# Patient Record
Sex: Male | Born: 1990 | Race: White | Hispanic: No | Marital: Single | Smoking: Current every day smoker
Health system: Southern US, Community
[De-identification: ages and names within clinical notes are randomized; demographics above are authoritative.]

---

## 2020-12-13 ENCOUNTER — Encounter (HOSPITAL_BASED_OUTPATIENT_CLINIC_OR_DEPARTMENT_OTHER): Payer: Self-pay

## 2020-12-13 ENCOUNTER — Other Ambulatory Visit: Payer: Self-pay

## 2020-12-13 ENCOUNTER — Emergency Department (HOSPITAL_BASED_OUTPATIENT_CLINIC_OR_DEPARTMENT_OTHER)
Admission: EM | Admit: 2020-12-13 | Discharge: 2020-12-13 | Disposition: A | Discharge status: Home / Self Care | Payer: Self-pay | Attending: Emergency Medicine | Admitting: Emergency Medicine

## 2020-12-13 DIAGNOSIS — Z20822 Contact with and (suspected) exposure to covid-19: Secondary | ICD-10-CM | POA: Insufficient documentation

## 2020-12-13 DIAGNOSIS — J069 Acute upper respiratory infection, unspecified: Secondary | ICD-10-CM | POA: Insufficient documentation

## 2020-12-13 LAB — SARS CORONAVIRUS 2 (TAT 6-24 HRS): SARS Coronavirus 2: NEGATIVE

## 2020-12-13 MED ORDER — BENZONATATE 100 MG PO CAPS: 100.0000 mg | ORAL_CAPSULE | Freq: Three times a day (TID) | ORAL | 0 refills | Status: AC | PRN

## 2020-12-13 MED ORDER — ONDANSETRON 4 MG PO TBDP: 4.0000 mg | ORAL_TABLET | Freq: Three times a day (TID) | ORAL | 0 refills | Status: AC | PRN

## 2020-12-13 MED ORDER — IBUPROFEN 400 MG PO TABS
600.0000 mg | ORAL_TABLET | Freq: Once | ORAL | Status: AC
  Administered 2020-12-13: 600 mg via ORAL
  Filled 2020-12-13: qty 1

## 2020-12-13 NOTE — ED Triage Notes (Signed)
PT here with multiple c/o including sore throat, sweats/chills, cough, HA, and body aches x 3 days.  Tylenol PTA.  Also c/o nausea but no V/D.  Loss of taste recently

## 2020-12-13 NOTE — Discharge Instructions (Addendum)
Please read instructions below.  You can alternate Tylenol/acetaminophen and Advil/ibuprofen/Motrin every 4 hours for sore throat, body aches, headache or fever.  Drink plenty of water.  Use saline nasal spray for congestion. You can take Tessalon every 8 hours as needed for cough. You can take the Zofran every 8 hours as needed for nausea. Wash your hands frequently. You have a COVID test pending. Please isolate at home while awaiting your results.  You can follow your results on MyChart. > If your test is negative, stay home until your fever has resolved/your symptoms are improving. > If your test is positive, isolate at home for at least 7 days after the day your symptoms initially began, and THEN at least 24 hours after you are fever-free without the help of medications AND your symptoms are improving. Only once your symptoms are improving and you are fever-free can you come out out of quarantine. Once, then you should wear a mask in public for another 5 days. Follow up with your primary care provider. Return to the ER for significant shortness of breath, uncontrollable vomiting, severe chest pain, or other concerning symptoms.

## 2020-12-13 NOTE — ED Provider Notes (Signed)
[facility] EMERGENCY DEPARTMENT Provider Note   CSN: [id number] Arrival date & time: 12/13/20  1005     History Chief Complaint  Patient presents with  . Sore Throat    Randy Guzman is a 30 y.o. male presenting for evaluation of URI symptoms that began on Saturday.  He endorses sore throat, loss of taste, dry cough, nasal congestion and intermittent nausea.  Also endorses headache.  Some subjective fevers with intermittent chills and sweats.  Has treated his symptoms with Tylenol.  No known exposures, has not had any COVID vaccines.  No shortness of breath or abdominal pain.  The history is provided by the patient.       History reviewed. No pertinent past medical history.  There are no problems to display for this patient.   History reviewed. No pertinent surgical history.     History reviewed. No pertinent family history.  Social History   Tobacco Use  . Smoking status: Never Smoker  . Smokeless tobacco: Never Used  Vaping Use  . Vaping Use: Never used  Substance Use Topics  . Alcohol use: Never  . Drug use: Never    Home Medications Prior to Admission medications   Medication Sig Start Date End Date Taking? Authorizing Provider  benzonatate (TESSALON) 100 MG capsule Take 1 capsule (100 mg total) by mouth 3 (three) times daily as needed for cough. 12/13/20  Yes Roxan Hockey, [place] N, PA-C  ondansetron (ZOFRAN ODT) 4 MG disintegrating tablet Take 1 tablet (4 mg total) by mouth every 8 (eight) hours as needed for nausea or vomiting. 12/13/20  Yes Gatlyn Lipari, [place] N, PA-C    Allergies    Patient has no allergy information on record.  Review of Systems   Review of Systems  HENT: Positive for congestion and sore throat.   Respiratory: Positive for cough.   Gastrointestinal: Positive for nausea.  Musculoskeletal: Negative for myalgias.  Neurological: Positive for headaches.    Physical Exam Updated Vital Signs BP 116/85 (BP Location: Right Arm)    Pulse 86   Temp 98.5 F (36.9 C) (Oral)   Resp 18   Ht 6' (1.829 m)   Wt 72.6 kg   SpO2 99%   BMI 21.70 kg/m   Physical Exam Vitals and nursing note reviewed.  Constitutional:      General: He is not in acute distress.    Appearance: He is well-developed.  HENT:     Head: Normocephalic and atraumatic.     Nose: Nose normal.     Mouth/Throat:     Mouth: Mucous membranes are moist.     Pharynx: Oropharynx is clear.  Eyes:     Conjunctiva/sclera: Conjunctivae normal.  Cardiovascular:     Rate and Rhythm: Normal rate and regular rhythm.  Pulmonary:     Effort: Pulmonary effort is normal. No respiratory distress.     Breath sounds: Normal breath sounds.  Abdominal:     General: Bowel sounds are normal.     Tenderness: There is no abdominal tenderness.  Musculoskeletal:     Cervical back: Normal range of motion and neck supple.  Neurological:     Mental Status: He is alert.  Psychiatric:        Mood and Affect: Mood normal.        Behavior: Behavior normal.     ED Results / Procedures / Treatments   Labs (all labs ordered are listed, but only abnormal results are displayed) Labs Reviewed  SARS CORONAVIRUS 2 (TAT  6-24 HRS)    EKG None  Radiology No results found.  Procedures Procedures   Medications Ordered in ED Medications  ibuprofen (ADVIL) tablet 600 mg (600 mg Oral Given 12/13/20 1057)    ED Course  I have reviewed the triage vital signs and the nursing notes.  Pertinent labs & imaging results that were available during my care of the patient were reviewed by me and considered in my medical decision making (see chart for details).    MDM Rules/Calculators/A&P                          Patients symptoms are consistent with URI, likely viral etiology. Afebrile, tolerating secretions.  Lungs clear to auscultation bilaterally.  Highly suspicious for COVID-19.  Test sent.  Discussed that antibiotics are not indicated for viral infections. Pt will be  discharged with symptomatic treatment.  Isolation precautions per current CDC guidelines.  Verbalizes understanding and is agreeable with plan. Pt is hemodynamically stable & in NAD prior to dc.  Discussed results, findings, treatment and follow up. Patient advised of return precautions. Patient verbalized understanding and agreed with plan.  Final Clinical Impression(s) / ED Diagnoses Final diagnoses:  Viral URI with cough    Rx / DC Orders ED Discharge Orders         Ordered    ondansetron (ZOFRAN ODT) 4 MG disintegrating tablet  Every 8 hours PRN        12/13/20 1117    benzonatate (TESSALON) 100 MG capsule  3 times daily PRN        12/13/20 1117           Renisha Cockrum, [place] N, [place] 12/13/20 1118    Pricilla Loveless, MD 12/15/20 1540

## 2021-01-01 ENCOUNTER — Emergency Department (HOSPITAL_COMMUNITY): Admission: EM | Admit: 2021-01-01 | Payer: Self-pay | Source: Home / Self Care

## 2021-07-31 ENCOUNTER — Emergency Department (HOSPITAL_BASED_OUTPATIENT_CLINIC_OR_DEPARTMENT_OTHER): Payer: Medicaid Other

## 2021-07-31 ENCOUNTER — Encounter (HOSPITAL_BASED_OUTPATIENT_CLINIC_OR_DEPARTMENT_OTHER): Payer: Self-pay | Admitting: Emergency Medicine

## 2021-07-31 ENCOUNTER — Emergency Department (HOSPITAL_BASED_OUTPATIENT_CLINIC_OR_DEPARTMENT_OTHER)
Admission: EM | Admit: 2021-07-31 | Discharge: 2021-08-01 | Disposition: A | Discharge status: Home / Self Care | Payer: Medicaid Other | Attending: Emergency Medicine | Admitting: Emergency Medicine

## 2021-07-31 DIAGNOSIS — R0789 Other chest pain: Secondary | ICD-10-CM | POA: Insufficient documentation

## 2021-07-31 DIAGNOSIS — F17210 Nicotine dependence, cigarettes, uncomplicated: Secondary | ICD-10-CM | POA: Diagnosis not present

## 2021-07-31 DIAGNOSIS — R110 Nausea: Secondary | ICD-10-CM | POA: Diagnosis not present

## 2021-07-31 DIAGNOSIS — R079 Chest pain, unspecified: Secondary | ICD-10-CM

## 2021-07-31 MED ORDER — DIAZEPAM 5 MG/ML IJ SOLN
5.0000 mg | Freq: Once | INTRAMUSCULAR | Status: AC
  Administered 2021-07-31: 5 mg via INTRAVENOUS
  Filled 2021-07-31: qty 2

## 2021-07-31 MED ORDER — LACTATED RINGERS IV BOLUS
1000.0000 mL | Freq: Once | INTRAVENOUS | Status: AC
  Administered 2021-07-31: 1000 mL via INTRAVENOUS

## 2021-07-31 MED ORDER — KETOROLAC TROMETHAMINE 15 MG/ML IJ SOLN
15.0000 mg | Freq: Once | INTRAMUSCULAR | Status: AC
  Administered 2021-07-31: 15 mg via INTRAVENOUS
  Filled 2021-07-31: qty 1

## 2021-07-31 NOTE — ED Triage Notes (Signed)
Sharp left sided chest pain since Sunday. Pain radiates to left shoulder and left neck. Mild nausea.

## 2021-07-31 NOTE — ED Provider Notes (Signed)
[facility] EMERGENCY DEPARTMENT Provider Note   CSN: [id number] Arrival date & time: 07/31/21  2251     History Chief Complaint  Patient presents with   Chest Pain    Randy Guzman is a 30 y.o. male.   Chest Pain Associated symptoms: nausea   Associated symptoms: no abdominal pain, no back pain, no cough, no dizziness, no fatigue, no fever, no headache, no numbness, no palpitations, no shortness of breath, no vomiting and no weakness   Patient presents for chest pain.  He was seen at outside hospital yesterday.  Diagnostic work-up there included CTA chest and CT head, and lab work.  UDS was positive for cocaine.  Other work-up results were unremarkable.  Today, he reports continued left-sided chest pain.  He has had intermittent nausea.  He has had decreased food intake.  He has continued to drink fluids.  He denies any shortness of breath, abdominal pain, or headache pain.  He does state that his head feels like there is a pressure inside it.  Despite his positive UDS yesterday, patient denies any drug use.  He drinks alcohol occasionally.  He is unaware of any medical conditions that he has.  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  He states that he was hospitalized for 2 months.  This occurred in [place].  He is unaware of what diagnosis he had at that time. HPI: A 30 year old patient presents for evaluation of chest pain. Initial onset of pain was less than one hour ago. The patient's chest pain is described as heaviness/pressure/tightness and is not worse with exertion. The patient complains of nausea. The patient's chest pain is middle- or left-sided, is not well-localized, is not sharp and does radiate to the arms/jaw/neck. The patient denies diaphoresis. The patient has no history of stroke, has no history of peripheral artery disease, has not smoked in the past 90 days, denies any history of treated diabetes, has no  relevant family history of coronary artery disease (first degree relative at less than age 58), is not hypertensive, has no history of hypercholesterolemia and does not have an elevated BMI (>=30).   History reviewed. No pertinent past medical history.  There are no problems to display for this patient.   History reviewed. No pertinent surgical history.     History reviewed. No pertinent family history.  Social History   Tobacco Use   Smoking status: Every Day    Packs/day: 0.50    Types: Cigarettes   Smokeless tobacco: Never  Vaping Use   Vaping Use: Never used  Substance Use Topics   Alcohol use: Never   Drug use: Never    Home Medications Prior to Admission medications   Medication Sig Start Date End Date Taking? Authorizing Provider  benzonatate (TESSALON) 100 MG capsule Take 1 capsule (100 mg total) by mouth 3 (three) times daily as needed for cough. 12/13/20   Robinson, [place] N, PA-C  ondansetron (ZOFRAN ODT) 4 MG disintegrating tablet Take 1 tablet (4 mg total) by mouth every 8 (eight) hours as needed for nausea or vomiting. 12/13/20   Robinson, [place] N, PA-C    Allergies    Patient has no known allergies.  Review of Systems   Review of Systems  Constitutional:  Positive for appetite change. Negative for activity change, chills, fatigue and fever.  HENT:  Negative for congestion, ear pain and sore throat.   Eyes:  Negative for pain and  visual disturbance.  Respiratory:  Negative for cough, chest tightness, shortness of breath and wheezing.   Cardiovascular:  Positive for chest pain. Negative for palpitations.  Gastrointestinal:  Positive for nausea. Negative for abdominal pain, diarrhea and vomiting.  Genitourinary:  Negative for dysuria, flank pain and hematuria.  Musculoskeletal:  Negative for arthralgias, back pain, joint swelling, myalgias and neck pain.  Skin:  Negative for color change and rash.  Neurological:  Negative for dizziness, seizures, syncope,  weakness, numbness and headaches.  Hematological:  Does not bruise/bleed easily.  Psychiatric/Behavioral:  Negative for confusion and decreased concentration.   All other systems reviewed and are negative.  Physical Exam Updated Vital Signs BP 135/81    Pulse 87    Temp 98.4 F (36.9 C) (Oral)    Resp 16    Ht 6' (1.829 m)    SpO2 98%    BMI 21.70 kg/m   Physical Exam Vitals and nursing note reviewed.  Constitutional:      General: He is not in acute distress.    Appearance: He is well-developed and normal weight. He is not ill-appearing, toxic-appearing or diaphoretic.  HENT:     Head: Normocephalic and atraumatic.  Eyes:     Extraocular Movements: Extraocular movements intact.     Conjunctiva/sclera: Conjunctivae normal.  Neck:     Vascular: No JVD.  Cardiovascular:     Rate and Rhythm: Normal rate and regular rhythm.     Heart sounds: No murmur heard.   No friction rub. No gallop.  Pulmonary:     Effort: Pulmonary effort is normal. No tachypnea or respiratory distress.     Breath sounds: Normal breath sounds. No decreased breath sounds, wheezing, rhonchi or rales.  Abdominal:     Palpations: Abdomen is soft.     Tenderness: There is no abdominal tenderness.  Musculoskeletal:        General: No swelling. Normal range of motion.     Cervical back: Normal range of motion and neck supple.     Right lower leg: No edema.     Left lower leg: No edema.  Skin:    General: Skin is warm and dry.     Capillary Refill: Capillary refill takes less than 2 seconds.     Coloration: Skin is not cyanotic or pale.  Neurological:     General: No focal deficit present.     Mental Status: He is alert and oriented to person, place, and time.  Psychiatric:        Mood and Affect: Mood normal.        Behavior: Behavior normal.    ED Results / Procedures / Treatments   Labs (all labs ordered are listed, but only abnormal results are displayed) Labs Reviewed  CBC - Abnormal; Notable for  the following components:      Result Value   WBC 13.0 (*)    MCHC 36.1 (*)    All other components within normal limits  BASIC METABOLIC PANEL  MAGNESIUM  TROPONIN I (HIGH SENSITIVITY)  TROPONIN I (HIGH SENSITIVITY)    EKG EKG Interpretation  Date/Time:  Tuesday July 31 2021 23:00:43 EST Ventricular Rate:  103 PR Interval:  137 QRS Duration: 98 QT Interval:  348 QTC Calculation: 456 R Axis:   69 Text Interpretation: Sinus tachycardia Borderline T wave abnormalities Confirmed by Godfrey Pick [phone]) on 07/31/2021 11:02:59 PM  Radiology DG Chest Portable 1 View  Result Date: 07/31/2021 CLINICAL DATA:  Chest pain EXAM: PORTABLE CHEST  1 VIEW COMPARISON:  None. FINDINGS: Heart and mediastinal contours are within normal limits. No focal opacities or effusions. No acute bony abnormality. IMPRESSION: No active disease. Electronically Signed   By: Charlett Nose M.D.   On: 07/31/2021 23:31    Procedures Procedures   Medications Ordered in ED Medications  lactated ringers bolus 1,000 mL (0 mLs Intravenous Stopped 08/01/21 0104)  ketorolac (TORADOL) 15 MG/ML injection 15 mg (15 mg Intravenous Given 07/31/21 2353)  diazepam (VALIUM) injection 5 mg (5 mg Intravenous Given 07/31/21 2354)    ED Course  I have reviewed the triage vital signs and the nursing notes.  Pertinent labs & imaging results that were available during my care of the patient were reviewed by me and considered in my medical decision making (see chart for details).    MDM Rules/Calculators/A&P HEAR Score: 3                        Healthy 30 year old male with history of drug abuse, presenting for 2 days of left-sided chest pain.  Associated symptom includes head fullness and intermittent nausea.  He underwent a thorough work-up yesterday at outside hospital.  This included CT scan of head and CTA of chest without acute findings, and normal lab work, including troponins.  EKG today shows sinus tachycardia with  nonspecific T wave abnormality.  Based on the symptoms, risk factors, and EKG findings, patient is a heart score of 3.  Will monitor and repeat lab work today.  IV fluids, Toradol, and Valium ordered for symptomatic relief.  On reassessment, patient resting comfortably.  He reports resolution of chest pain.  Blood pressure is improved.  He does have a primary care doctor that he can set up follow-up appointments for.  He is concerned about his blood pressure.  He may benefit from medication if he is found to have hypertension.  Patient was discharged in good condition.  Final Clinical Impression(s) / ED Diagnoses Final diagnoses:  Chest pain, unspecified type    Rx / DC Orders ED Discharge Orders     None        Gloris Manchester, MD 08/01/21 [phone]

## 2021-08-01 LAB — CBC
HCT: 44.9 % (ref 39.0–52.0)
Hemoglobin: 16.2 g/dL (ref 13.0–17.0)
MCH: 31.5 pg (ref 26.0–34.0)
MCHC: 36.1 g/dL — ABNORMAL HIGH (ref 30.0–36.0)
MCV: 87.4 fL (ref 80.0–100.0)
Platelets: 311 10*3/uL (ref 150–400)
RBC: 5.14 MIL/uL (ref 4.22–5.81)
RDW: 12.5 % (ref 11.5–15.5)
WBC: 13 10*3/uL — ABNORMAL HIGH (ref 4.0–10.5)
nRBC: 0 % (ref 0.0–0.2)

## 2021-08-01 LAB — BASIC METABOLIC PANEL
Anion gap: 11 (ref 5–15)
BUN: 14 mg/dL (ref 6–20)
CO2: 24 mmol/L (ref 22–32)
Calcium: 9.4 mg/dL (ref 8.9–10.3)
Chloride: 100 mmol/L (ref 98–111)
Creatinine, Ser: 0.97 mg/dL (ref 0.61–1.24)
GFR, Estimated: >60 mL/min (ref >60)
Glucose, Bld: 92 mg/dL (ref 70–99)
Potassium: 3.9 mmol/L (ref 3.5–5.1)
Sodium: 135 mmol/L (ref 135–145)

## 2021-08-01 LAB — MAGNESIUM: Magnesium: 2.1 mg/dL (ref 1.7–2.4)

## 2021-08-01 LAB — TROPONIN I (HIGH SENSITIVITY): Troponin I (High Sensitivity): 4 ng/L (ref <18)

## 2022-04-10 IMAGING — DX DG CHEST 1V PORT
1 series · 1 of 1 positions shown · non-contrast
Comparison: None.

CLINICAL DATA: Chest pain

EXAM:
PORTABLE CHEST 1 VIEW

[chest ap]
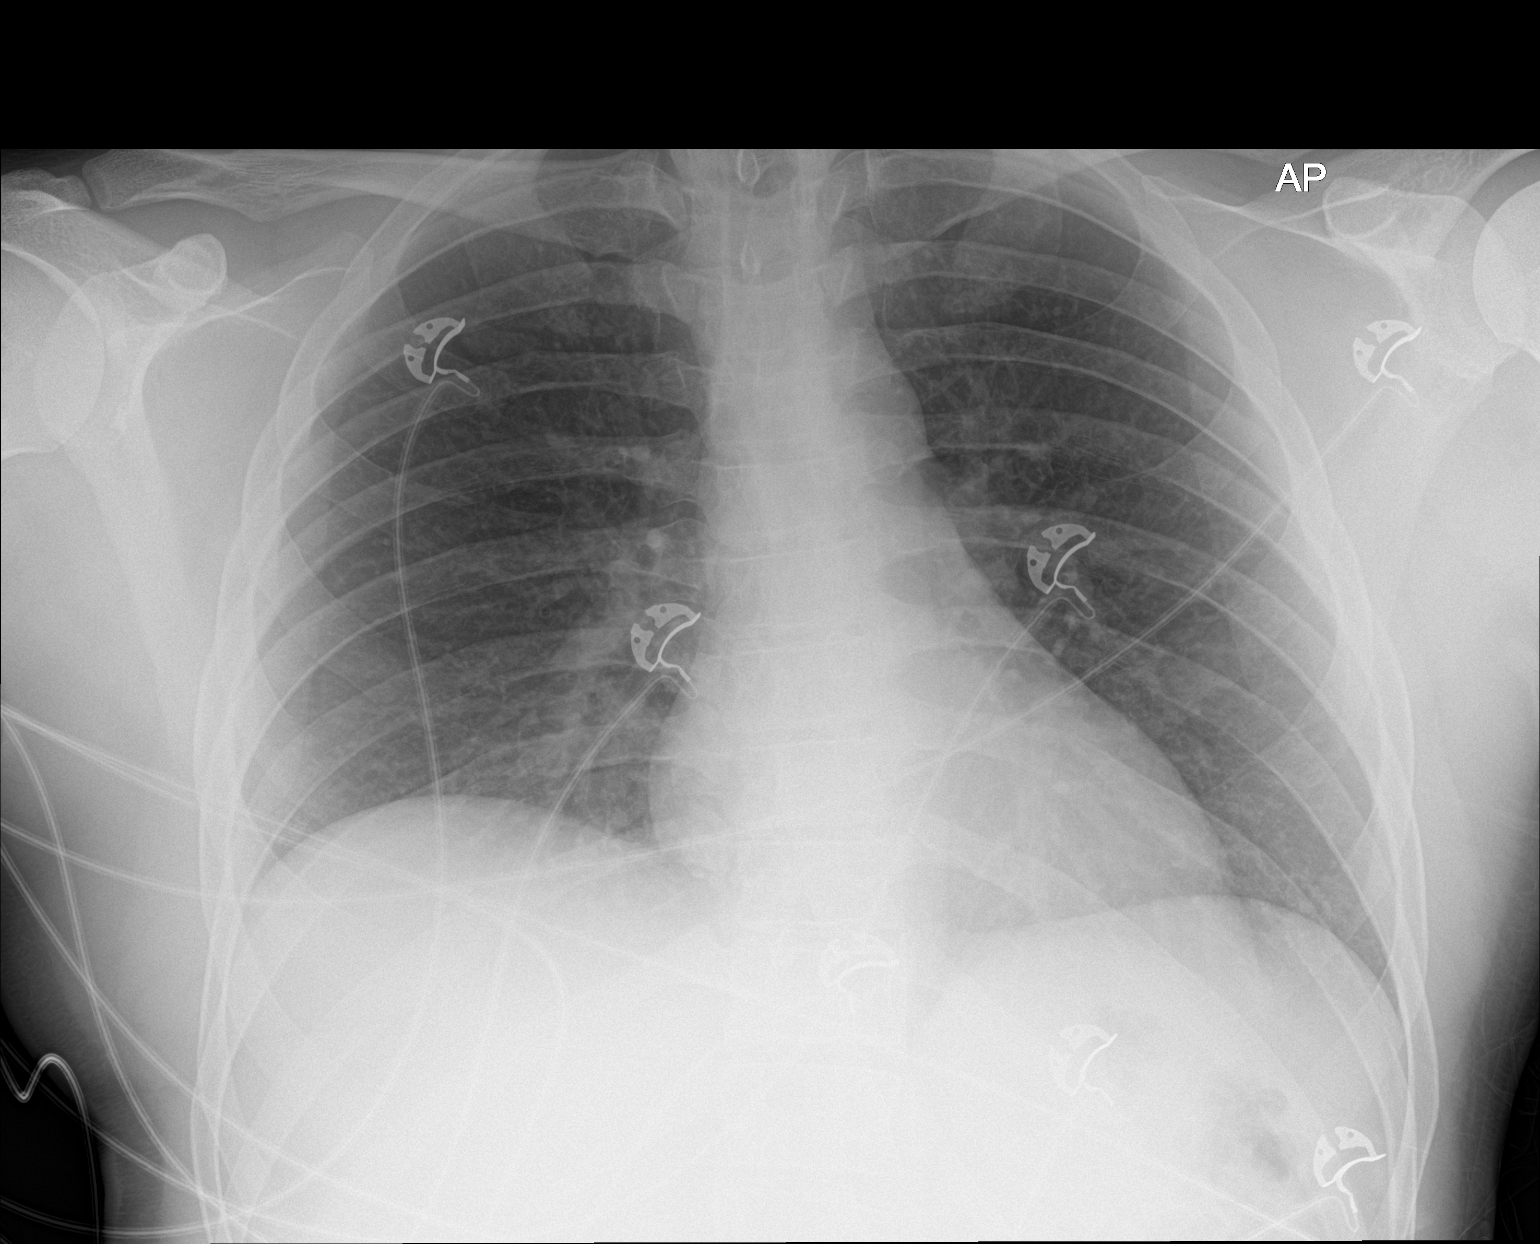

[1 of 1 positions shown; findings below may reference images not displayed]

FINDINGS: Heart and mediastinal contours are within normal limits. No focal
opacities or effusions. No acute bony abnormality.
IMPRESSION: No active disease.
